# Patient Record
Sex: Male | Born: 1985 | Hispanic: No | Marital: Married | Smoking: Never smoker
Health system: Southern US, Community
[De-identification: ages and names within clinical notes are randomized; demographics above are authoritative.]

## PROBLEM LIST (undated history)

## (undated) DIAGNOSIS — N200 Calculus of kidney: Secondary | ICD-10-CM

---

## 2020-02-23 ENCOUNTER — Encounter (HOSPITAL_COMMUNITY): Payer: Self-pay | Admitting: Emergency Medicine

## 2020-02-23 ENCOUNTER — Emergency Department (HOSPITAL_COMMUNITY)
Admission: EM | Admit: 2020-02-23 | Discharge: 2020-02-24 | Disposition: A | Discharge status: Home / Self Care | Payer: Self-pay | Attending: Emergency Medicine | Admitting: Emergency Medicine

## 2020-02-23 ENCOUNTER — Emergency Department (HOSPITAL_COMMUNITY): Payer: Self-pay

## 2020-02-23 ENCOUNTER — Other Ambulatory Visit: Payer: Self-pay

## 2020-02-23 DIAGNOSIS — N201 Calculus of ureter: Secondary | ICD-10-CM | POA: Insufficient documentation

## 2020-02-23 DIAGNOSIS — F17200 Nicotine dependence, unspecified, uncomplicated: Secondary | ICD-10-CM | POA: Insufficient documentation

## 2020-02-23 LAB — URINALYSIS, ROUTINE W REFLEX MICROSCOPIC
Bilirubin Urine: NEGATIVE
Glucose, UA: NEGATIVE mg/dL
Ketones, ur: 5 mg/dL — AB
Leukocytes,Ua: NEGATIVE
Nitrite: NEGATIVE
Protein, ur: 30 mg/dL — AB
RBC / HPF: >50 RBC/hpf — ABNORMAL HIGH (ref 0–5)
Specific Gravity, Urine: 1.031 — ABNORMAL HIGH (ref 1.005–1.030)
pH: 5 (ref 5.0–8.0)

## 2020-02-23 LAB — CBC WITH DIFFERENTIAL/PLATELET
Abs Immature Granulocytes: 0.06 10*3/uL (ref 0.00–0.07)
Basophils Absolute: 0 10*3/uL (ref 0.0–0.1)
Basophils Relative: 0 %
Eosinophils Absolute: 0 10*3/uL (ref 0.0–0.5)
Eosinophils Relative: 0 %
HCT: 45.4 % (ref 39.0–52.0)
Hemoglobin: 15.3 g/dL (ref 13.0–17.0)
Immature Granulocytes: 0 %
Lymphocytes Relative: 8 %
Lymphs Abs: 1.2 10*3/uL (ref 0.7–4.0)
MCH: 29.2 pg (ref 26.0–34.0)
MCHC: 33.7 g/dL (ref 30.0–36.0)
MCV: 86.6 fL (ref 80.0–100.0)
Monocytes Absolute: 0.6 10*3/uL (ref 0.1–1.0)
Monocytes Relative: 4 %
Neutro Abs: 14.1 10*3/uL — ABNORMAL HIGH (ref 1.7–7.7)
Neutrophils Relative %: 88 %
Platelets: 323 10*3/uL (ref 150–400)
RBC: 5.24 MIL/uL (ref 4.22–5.81)
RDW: 12.5 % (ref 11.5–15.5)
WBC: 15.9 10*3/uL — ABNORMAL HIGH (ref 4.0–10.5)
nRBC: 0 % (ref 0.0–0.2)

## 2020-02-23 LAB — COMPREHENSIVE METABOLIC PANEL
ALT: 57 U/L — ABNORMAL HIGH (ref 0–44)
AST: 32 U/L (ref 15–41)
Albumin: 4.5 g/dL (ref 3.5–5.0)
Alkaline Phosphatase: 67 U/L (ref 38–126)
Anion gap: 8 (ref 5–15)
BUN: 20 mg/dL (ref 6–20)
CO2: 26 mmol/L (ref 22–32)
Calcium: 9.4 mg/dL (ref 8.9–10.3)
Chloride: 104 mmol/L (ref 98–111)
Creatinine, Ser: 1.02 mg/dL (ref 0.61–1.24)
GFR calc Af Amer: >60 mL/min (ref >60)
GFR calc non Af Amer: >60 mL/min (ref >60)
Glucose, Bld: 132 mg/dL — ABNORMAL HIGH (ref 70–99)
Potassium: 3.8 mmol/L (ref 3.5–5.1)
Sodium: 138 mmol/L (ref 135–145)
Total Bilirubin: 1.3 mg/dL — ABNORMAL HIGH (ref 0.3–1.2)
Total Protein: 8 g/dL (ref 6.5–8.1)

## 2020-02-23 LAB — LIPASE, BLOOD: Lipase: 30 U/L (ref 11–51)

## 2020-02-23 MED ORDER — MORPHINE SULFATE (PF) 4 MG/ML IV SOLN
4.0000 mg | Freq: Once | INTRAVENOUS | Status: AC
  Administered 2020-02-23: 4 mg via INTRAVENOUS
  Filled 2020-02-23: qty 1

## 2020-02-23 MED ORDER — HYDROCODONE-ACETAMINOPHEN 5-325 MG PO TABS
1.0000 | ORAL_TABLET | Freq: Once | ORAL | Status: AC
  Administered 2020-02-23: 1 via ORAL
  Filled 2020-02-23: qty 1

## 2020-02-23 MED ORDER — ONDANSETRON HCL 4 MG/2ML IJ SOLN
4.0000 mg | Freq: Once | INTRAMUSCULAR | Status: AC
  Administered 2020-02-23: 4 mg via INTRAVENOUS
  Filled 2020-02-23: qty 2

## 2020-02-23 MED ORDER — KETOROLAC TROMETHAMINE 15 MG/ML IJ SOLN: 15.0000 mg | Freq: Once | INTRAMUSCULAR | Status: DC

## 2020-02-23 MED ORDER — SODIUM CHLORIDE 0.9 % IV BOLUS
500.0000 mL | Freq: Once | INTRAVENOUS | Status: AC
  Administered 2020-02-23: 500 mL via INTRAVENOUS

## 2020-02-23 NOTE — ED Provider Notes (Signed)
[facility] EMERGENCY DEPARTMENT Provider Note   CSN: [id number] Arrival date & time: 02/23/20  0737     History Chief Complaint  Patient presents with  . Flank Pain    Keith Gay is a 34 y.o. male patient is a 34 year old man with no known past medical history who presents today for evaluation of left-sided flank pain.  His pain started 1730 tonight while he was resting. It started on the left side of his back and is now radiating around into his left lower quadrant anteriorly.  He reports that when the pain was bad he had 1 episode of vomiting.    He states that about 2 weeks ago he had a similar episode, however the pain only lasted about half an hour and he did not seek medical care and evaluation at that time.    He denies any diarrhea or constipation.  He denies history of prior abdominal surgeries.  He does report polyuria however denies dysuria.  He denies hematuria.  No fevers.  He denies any recent trauma.  He was given 30 mg of Toradol in route by EMS which has improved his pain.  History obtained through assistance with Spanish-speaking professional medical interpreter.  HPI     History reviewed. No pertinent past medical history.  There are no problems to display for this patient.   History reviewed. No pertinent surgical history.     No family history on file.  Social History   Tobacco Use  . Smoking status: Current Every Day Smoker    Packs/day: 0.50    Years: 15.00    Pack years: 7.50  . Smokeless tobacco: Never Used  Substance Use Topics  . Alcohol use: Never  . Drug use: Never    Home Medications Prior to Admission medications   Medication Sig Start Date End Date Taking? Authorizing Provider  HYDROcodone-acetaminophen (NORCO/VICODIN) 5-325 MG tablet Take 1 tablet by mouth every 6 (six) hours as needed for severe pain. 02/24/20   Lorin Glass, PA-C  ondansetron (ZOFRAN ODT) 4 MG disintegrating tablet Take 1 tablet (4 mg total) by mouth  every 8 (eight) hours as needed for nausea or vomiting. 02/24/20   Lorin Glass, PA-C  tamsulosin (FLOMAX) 0.4 MG CAPS capsule Take 1 capsule (0.4 mg total) by mouth daily. May stop once kidney stone passes 02/24/20   Lorin Glass, PA-C    Allergies    Patient has no known allergies.  Review of Systems   Review of Systems  Constitutional: Negative for chills, fatigue and fever.  Respiratory: Negative for choking and shortness of breath.   Cardiovascular: Negative for chest pain.  Gastrointestinal: Positive for abdominal pain, nausea and vomiting. Negative for constipation and diarrhea.  Endocrine: Positive for polyuria. Negative for polydipsia and polyphagia.  Genitourinary: Positive for flank pain and frequency. Negative for discharge, dysuria, hematuria, penile pain, scrotal swelling, testicular pain and urgency.  Musculoskeletal: Positive for back pain. Negative for neck pain.  Neurological: Negative for weakness and headaches.  Psychiatric/Behavioral: Negative for confusion.  All other systems reviewed and are negative.   Physical Exam Updated Vital Signs BP 115/79   Pulse 73   Temp 99.3 F (37.4 C) (Oral)   Resp 15   Ht 5\' 6"  (1.676 m)   Wt 99.8 kg   SpO2 100%   BMI 35.51 kg/m   Physical Exam Vitals and nursing note reviewed.  Constitutional:      General: He is not in acute distress.  Appearance: He is well-developed. He is not ill-appearing.  HENT:     Head: Normocephalic and atraumatic.     Mouth/Throat:     Mouth: Mucous membranes are moist.  Eyes:     Conjunctiva/sclera: Conjunctivae normal.  Cardiovascular:     Rate and Rhythm: Normal rate and regular rhythm.     Pulses: Normal pulses.     Heart sounds: Normal heart sounds. No murmur.  Pulmonary:     Effort: Pulmonary effort is normal. No respiratory distress.     Breath sounds: Normal breath sounds.  Abdominal:     Palpations: Abdomen is soft.     Tenderness: There is no abdominal  tenderness (Left lower quadrant). There is left CVA tenderness. There is no guarding or rebound.  Musculoskeletal:     Cervical back: Normal range of motion and neck supple.     Right lower leg: No edema.     Left lower leg: No edema.  Skin:    General: Skin is warm and dry.  Neurological:     General: No focal deficit present.     Mental Status: He is alert and oriented to person, place, and time.  Psychiatric:        Mood and Affect: Mood normal.        Behavior: Behavior normal.     ED Results / Procedures / Treatments   Labs (all labs ordered are listed, but only abnormal results are displayed) Labs Reviewed  URINALYSIS, ROUTINE W REFLEX MICROSCOPIC - Abnormal; Notable for the following components:      Result Value   APPearance TURBID (*)    Specific Gravity, Urine 1.031 (*)    Hgb urine dipstick LARGE (*)    Ketones, ur 5 (*)    Protein, ur 30 (*)    RBC / HPF >50 (*)    Bacteria, UA RARE (*)    Non Squamous Epithelial 0-5 (*)    All other components within normal limits  COMPREHENSIVE METABOLIC PANEL - Abnormal; Notable for the following components:   Glucose, Bld 132 (*)    ALT 57 (*)    Total Bilirubin 1.3 (*)    All other components within normal limits  CBC WITH DIFFERENTIAL/PLATELET - Abnormal; Notable for the following components:   WBC 15.9 (*)    Neutro Abs 14.1 (*)    All other components within normal limits  URINE CULTURE  LIPASE, BLOOD    EKG None  Radiology CT Renal Stone Study  Result Date: 02/23/2020 CLINICAL DATA:  Left-sided flank pain. EXAM: CT ABDOMEN AND PELVIS WITHOUT CONTRAST TECHNIQUE: Multidetector CT imaging of the abdomen and pelvis was performed following the standard protocol without IV contrast. COMPARISON:  None. FINDINGS: Lower chest: The lung bases are clear. The heart size is normal. Hepatobiliary: There is decreased hepatic attenuation suggestive of hepatic steatosis. Normal gallbladder.There is no biliary ductal dilation.  Pancreas: Normal contours without ductal dilatation. No peripancreatic fluid collection. Spleen: No splenic laceration or hematoma. Adrenals/Urinary Tract: --Adrenal glands: No adrenal hemorrhage. --Right kidney/ureter: No hydronephrosis or perinephric hematoma. --Left kidney/ureter: There is mild left-sided hydronephrosis secondary to an obstructing 6 mm stone at the left UVJ (axial series 2, image 90). There are no residual remaining stones in the left kidney. --Urinary bladder: Unremarkable. Stomach/Bowel: --Stomach/Duodenum: No hiatal hernia or other gastric abnormality. Normal duodenal course and caliber. --Small bowel: No dilatation or inflammation. --Colon: No focal abnormality. --Appendix: Normal. Vascular/Lymphatic: Normal course and caliber of the major abdominal vessels. --No retroperitoneal  lymphadenopathy. --No mesenteric lymphadenopathy. --No pelvic or inguinal lymphadenopathy. Reproductive: Unremarkable Other: No ascites or free air. The abdominal wall is normal. Musculoskeletal. No acute displaced fractures. IMPRESSION: Mild left-sided hydronephrosis secondary to an obstructing 6 mm stone at the left UVJ. Hepatic steatosis Electronically Signed   By: Katherine Mantle M.D.   On: 02/23/2020 22:19    Procedures Procedures (including critical care time)  Medications Ordered in ED Medications  ondansetron (ZOFRAN) injection 4 mg (4 mg Intravenous Given 02/23/20 2036)  sodium chloride 0.9 % bolus 500 mL (0 mLs Intravenous Stopped 02/23/20 2130)  morphine 4 MG/ML injection 4 mg (4 mg Intravenous Given 02/23/20 2036)  HYDROcodone-acetaminophen (NORCO/VICODIN) 5-325 MG per tablet 1 tablet (1 tablet Oral Given 02/23/20 2236)  morphine 4 MG/ML injection 4 mg (4 mg Intravenous Given 02/23/20 2319)    ED Course  I have reviewed the triage vital signs and the nursing notes.  Pertinent labs & imaging results that were available during my care of the patient were reviewed by me and considered in my medical  decision making (see chart for details).  Clinical Course as of Feb 24 23  Mon Feb 23, 2020  2315 Patient reevaluated, his pain is returning.  He did get a Vicodin half an hour ago however appears uncomfortable at this time.  Additional dose of morphine is ordered.  He was given Toradol in route by EMS therefore additional Toradol is not ordered.   [EH]  Tue Feb 24, 2020  0016 Patient is reevaluated, when I initially walked in the room he was sleeping with his left arm up over his head.  This is the arm that his blood pressure cuff and pulse oximeter were around.  When awakened and his arm appropriately positioned his blood pressure became 115/79, Sp02 100%.  He reports his pain is gone at this time and wishes for discharge home.     [EH]    Clinical Course User Index [EH] Norman Clay   MDM Rules/Calculators/A&P                     Patient presents today for evaluation of left-sided flank pain radiating into his left lower abdomen.  Does not have a known history of kidney stones. On exam he has left-sided CVA tenderness to percussion along with left lower quadrant abdominal pain. Highly suspicious for ureteral stone. Given that this is his first occurrence plan to obtain CT renal study.  In addition, given his polyuria we will obtain basic labs, UA.  Will attempt symptomatic management.  CT renal study shows a left-sided 6 mm ureteral left at the level of the UVJ that appears to be obstructing causing mild left-sided hydronephrosis.    Urine shows over 50 red cells with rare bacteria, no white cells and 0-5 squamous epithelial cells.  Urine is sent for culture. CMP is unremarkable.  CBC does show mild leukocytosis which I suspect is reactive secondary to pain. Lipase is not elevated.  Patient's pain was treated with morphine IV x2.  He was given Toradol by EMS prior to arrival therefore it was not repeated. He was treated with Zofran here.  He was able to p.o. challenge  without difficulty. [facility] PMP is reviewed and he is given prescription for Vicodin in addition to Flomax and Zofran. We discussed that as a stone is 6 mm there is a possibility that it may not pass.  We discussed the importance of outpatient follow-up with urology.  In addition recommended that if he needs additional emergency medical care that he go to [facility] to be seen there.  Return precautions were discussed with patient who states their understanding.  At the time of discharge patient denied any unaddressed complaints or concerns.  Patient is agreeable for discharge home.  Note: Portions of this report may have been transcribed using voice recognition software. Every effort was made to ensure accuracy; however, inadvertent computerized transcription errors may be present  Final Clinical Impression(s) / ED Diagnoses Final diagnoses:  Ureterolithiasis    Rx / DC Orders ED Discharge Orders         Ordered    HYDROcodone-acetaminophen (NORCO/VICODIN) 5-325 MG tablet  Every 6 hours PRN     02/24/20 0022    ondansetron (ZOFRAN ODT) 4 MG disintegrating tablet  Every 8 hours PRN     02/24/20 0022    tamsulosin (FLOMAX) 0.4 MG CAPS capsule  Daily     02/24/20 0022           Cristina Gong, PA-C 02/24/20 2094    Vanetta Mulders, MD 02/26/20 1930

## 2020-02-23 NOTE — Discharge Instructions (Addendum)

## 2020-02-23 NOTE — ED Notes (Signed)
Pt given saltines and something to drink

## 2020-02-23 NOTE — ED Triage Notes (Addendum)
Pt from home via [facility]. C/O left sided flank pain that started at 1730 tonight. Denies dysuria. Also C/O polyuria. Pt received 30mg  Toradol en route.

## 2020-02-24 MED ORDER — ONDANSETRON 4 MG PO TBDP: 4.0000 mg | ORAL_TABLET | Freq: Three times a day (TID) | ORAL | 0 refills | Status: AC | PRN

## 2020-02-24 MED ORDER — TAMSULOSIN HCL 0.4 MG PO CAPS: 0.4000 mg | ORAL_CAPSULE | Freq: Every day | ORAL | 0 refills | Status: AC

## 2020-02-24 MED ORDER — HYDROCODONE-ACETAMINOPHEN 5-325 MG PO TABS: 1.0000 | ORAL_TABLET | Freq: Four times a day (QID) | ORAL | 0 refills | Status: AC | PRN

## 2020-02-25 LAB — URINE CULTURE: Culture: NO GROWTH

## 2020-06-20 ENCOUNTER — Other Ambulatory Visit: Payer: Self-pay

## 2020-06-20 ENCOUNTER — Emergency Department
Admission: EM | Admit: 2020-06-20 | Discharge: 2020-06-20 | Disposition: A | Discharge status: Home / Self Care | Payer: Self-pay | Attending: Emergency Medicine | Admitting: Emergency Medicine

## 2020-06-20 ENCOUNTER — Encounter: Payer: Self-pay | Admitting: Emergency Medicine

## 2020-06-20 DIAGNOSIS — M25519 Pain in unspecified shoulder: Secondary | ICD-10-CM | POA: Insufficient documentation

## 2020-06-20 DIAGNOSIS — Z87891 Personal history of nicotine dependence: Secondary | ICD-10-CM | POA: Insufficient documentation
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
## 2020-06-20 NOTE — Discharge Instructions (Signed)
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
## 2020-10-04 IMAGING — CT CT RENAL STONE PROTOCOL
2 of 4 series · 16 of 46 positions shown, 18 images · non-contrast
Comparison: None.

CLINICAL DATA: Left-sided flank pain.

EXAM:
CT ABDOMEN AND PELVIS WITHOUT CONTRAST
TECHNIQUE: Multidetector CT imaging of the abdomen and pelvis was performed
following the standard protocol without IV contrast.

[Series 2: axial st · axial · 0.87mm/px · z∈[-358,+112]mm · 13 of 108 slices shown, 15 images]
[im 7/108  soft-tissue]
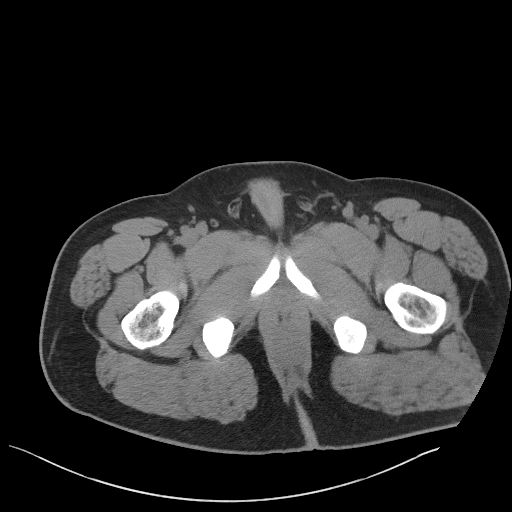
[im 7/108  bone]
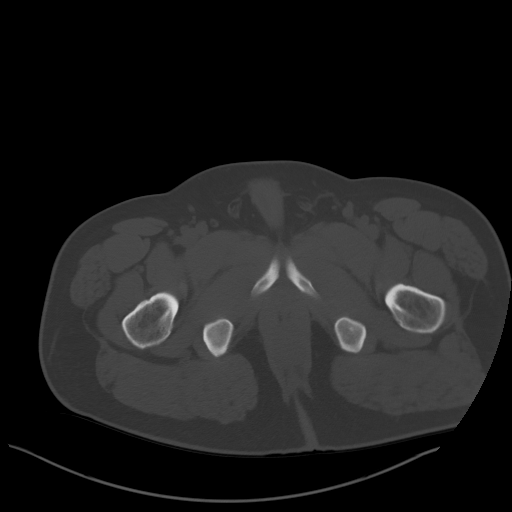
[im 14/108  soft-tissue]
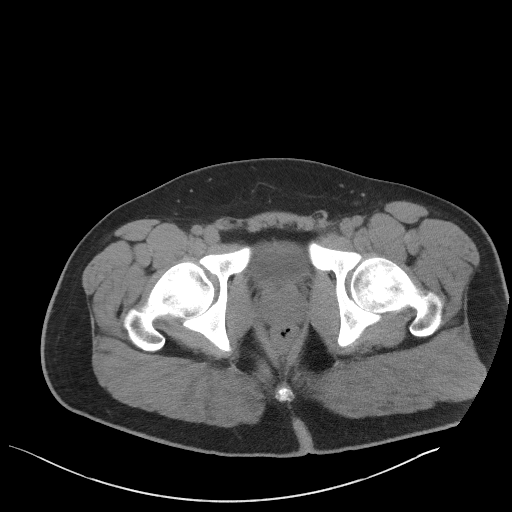
[im 21/108  soft-tissue]
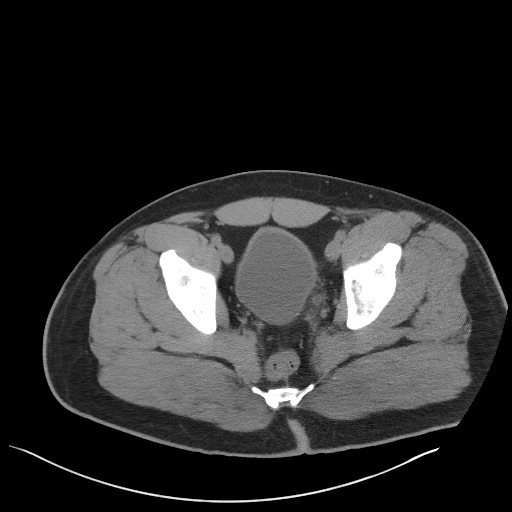
[im 34/108  soft-tissue]
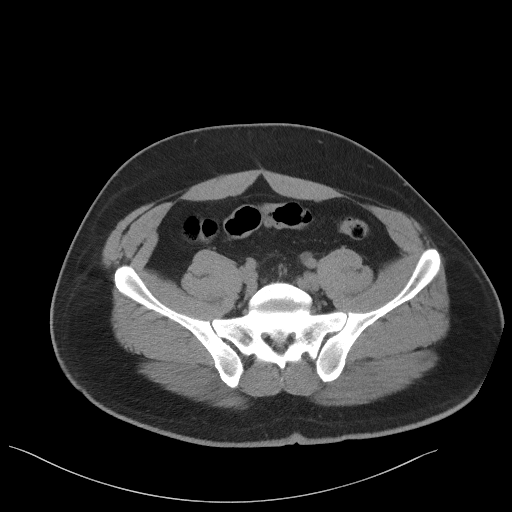
[im 41/108  soft-tissue]
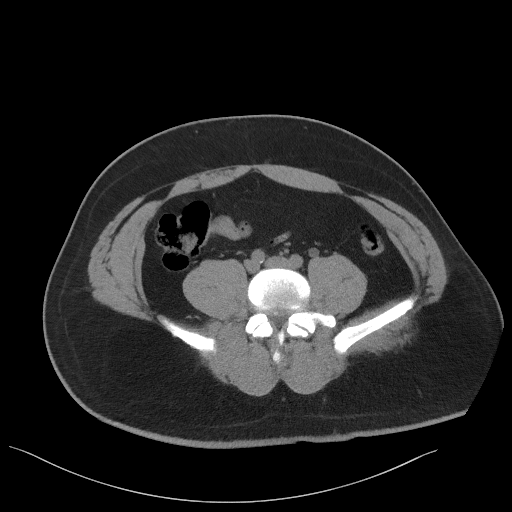
[im 47/108  soft-tissue]
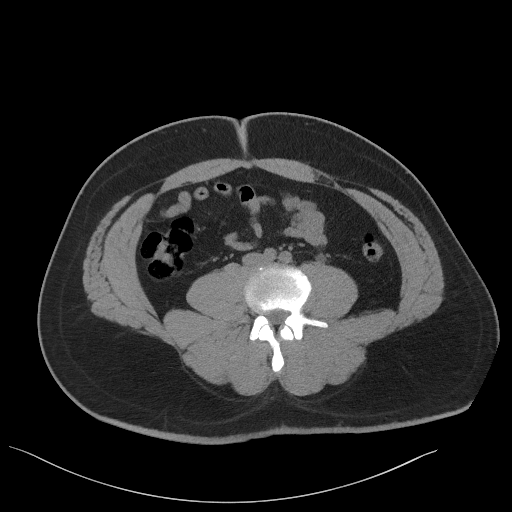
[im 54/108  soft-tissue]
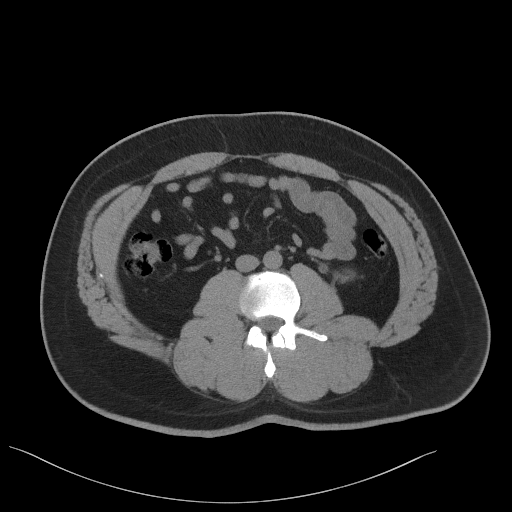
[im 61/108  soft-tissue]
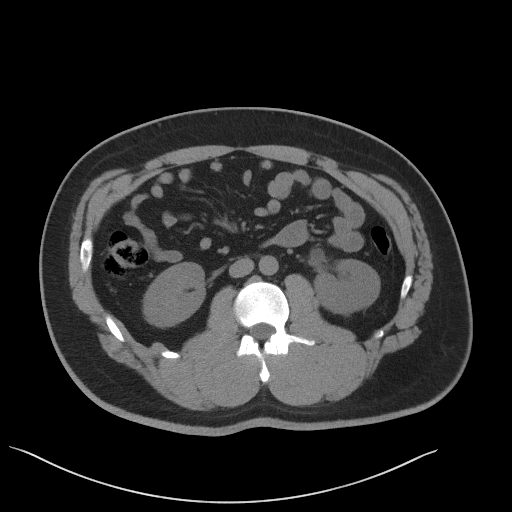
[im 67/108  soft-tissue]
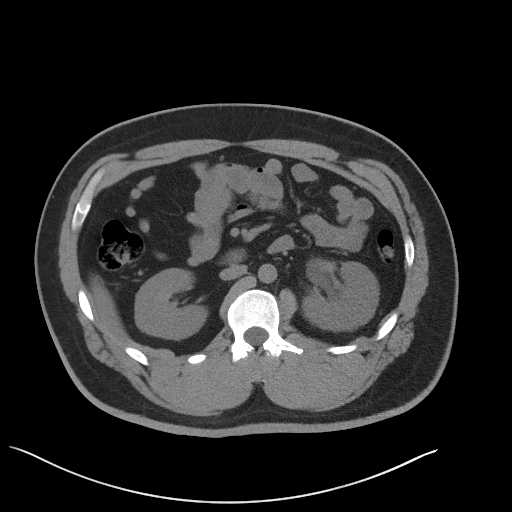
[im 67/108  bone]
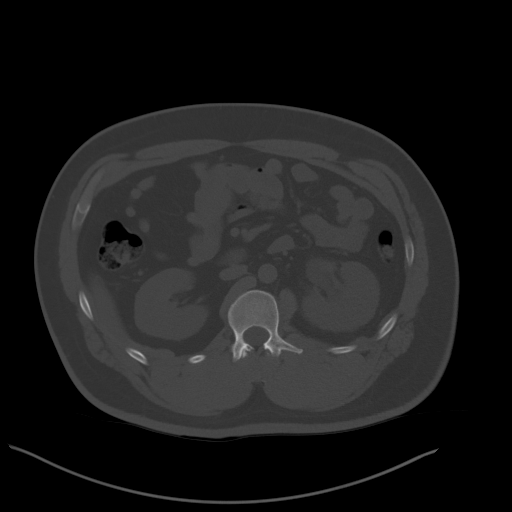
[im 74/108  soft-tissue]
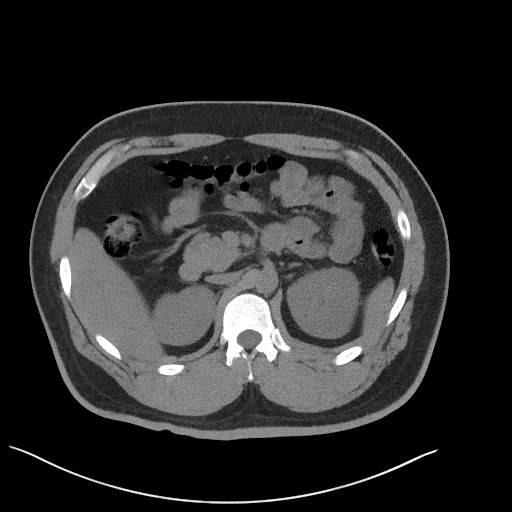
[im 87/108  soft-tissue]
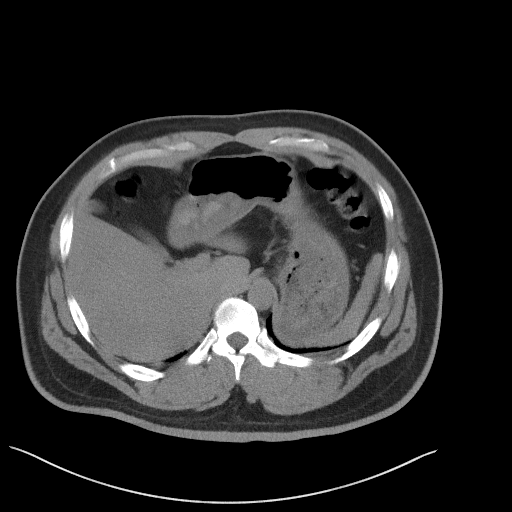
[im 94/108  soft-tissue]
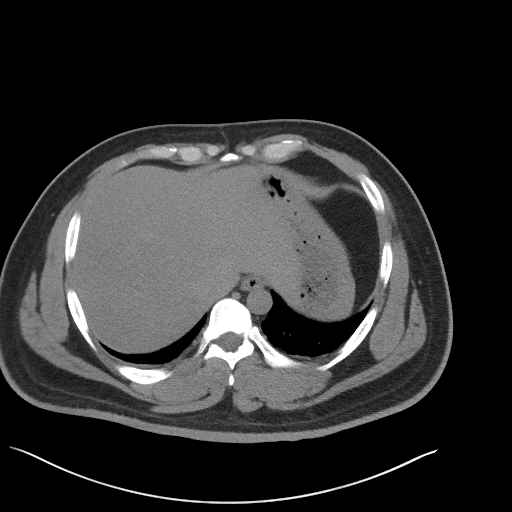
[im 101/108  soft-tissue]
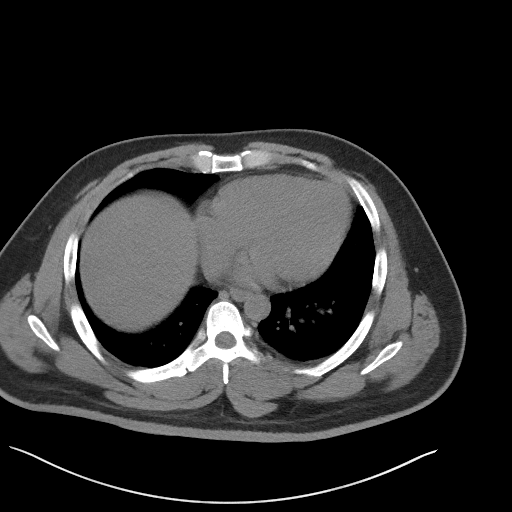

[Series 5: coronal st · coronal · 0.90mm/px · 3 of 101 slices shown]
[im 34/101  soft-tissue]
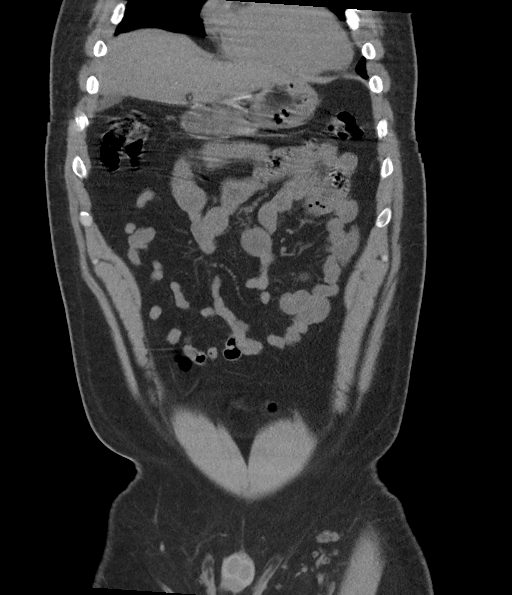
[im 45/101  soft-tissue]
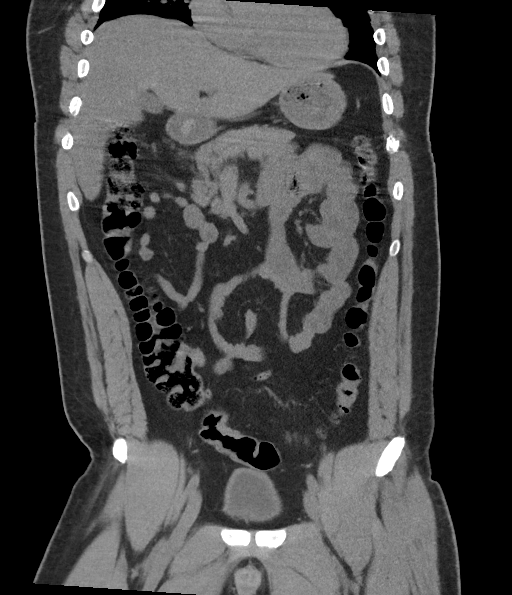
[im 56/101  soft-tissue]
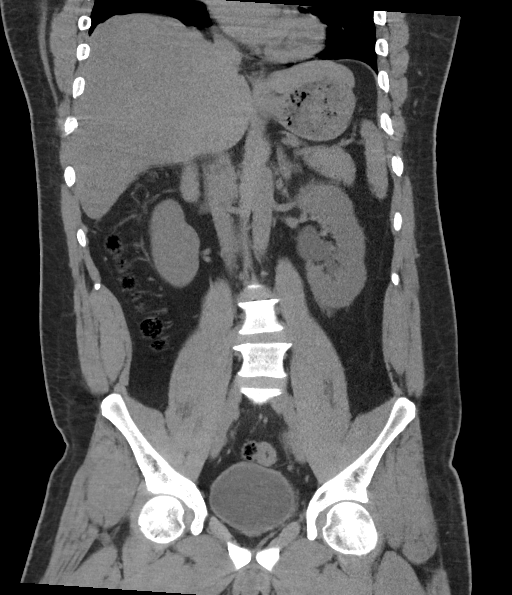

[16 of 46 positions shown; findings below may reference images not displayed]

FINDINGS: Lower chest: The lung bases are clear. The heart size is normal.

Hepatobiliary: There is decreased hepatic attenuation suggestive of
hepatic steatosis. Normal gallbladder.There is no biliary ductal
dilation.

Pancreas: Normal contours without ductal dilatation. No
peripancreatic fluid collection.

Spleen: No splenic laceration or hematoma.

Adrenals/Urinary Tract:

--Adrenal glands: No adrenal hemorrhage.

--Right kidney/ureter: No hydronephrosis or perinephric hematoma.

--Left kidney/ureter: There is mild left-sided hydronephrosis
secondary to an obstructing 6 mm stone at the left UVJ (axial series
2, image 90). There are no residual remaining stones in the left
kidney.

--Urinary bladder: Unremarkable.

Stomach/Bowel:

--Stomach/Duodenum: No hiatal hernia or other gastric abnormality.
Normal duodenal course and caliber.

--Small bowel: No dilatation or inflammation.

--Colon: No focal abnormality.

--Appendix: Normal.

Vascular/Lymphatic: Normal course and caliber of the major abdominal
vessels.

--No retroperitoneal lymphadenopathy.

--No mesenteric lymphadenopathy.

--No pelvic or inguinal lymphadenopathy.

Reproductive: Unremarkable

Other: No ascites or free air. The abdominal wall is normal.

Musculoskeletal. No acute displaced fractures.
IMPRESSION: Mild left-sided hydronephrosis secondary to an obstructing 6 mm
stone at the left UVJ.

Hepatic steatosis

## 2024-04-06 ENCOUNTER — Other Ambulatory Visit: Payer: Self-pay

## 2024-04-06 ENCOUNTER — Emergency Department (HOSPITAL_COMMUNITY)
Admission: EM | Admit: 2024-04-06 | Discharge: 2024-04-06 | Disposition: A | Discharge status: Home / Self Care | Payer: Self-pay | Attending: Emergency Medicine | Admitting: Emergency Medicine

## 2024-04-06 ENCOUNTER — Encounter (HOSPITAL_COMMUNITY): Payer: Self-pay | Admitting: Emergency Medicine

## 2024-04-06 ENCOUNTER — Emergency Department (HOSPITAL_COMMUNITY): Payer: Self-pay

## 2024-04-06 DIAGNOSIS — N200 Calculus of kidney: Secondary | ICD-10-CM | POA: Insufficient documentation

## 2024-04-06 LAB — CBC WITH DIFFERENTIAL/PLATELET
Abs Immature Granulocytes: 0.01 10*3/uL (ref 0.00–0.07)
Basophils Absolute: 0.1 10*3/uL (ref 0.0–0.1)
Basophils Relative: 1 %
Eosinophils Absolute: 0.1 10*3/uL (ref 0.0–0.5)
Eosinophils Relative: 2 %
HCT: 47.4 % (ref 39.0–52.0)
Hemoglobin: 15.7 g/dL (ref 13.0–17.0)
Immature Granulocytes: 0 %
Lymphocytes Relative: 41 %
Lymphs Abs: 3 10*3/uL (ref 0.7–4.0)
MCH: 28.6 pg (ref 26.0–34.0)
MCHC: 33.1 g/dL (ref 30.0–36.0)
MCV: 86.3 fL (ref 80.0–100.0)
Monocytes Absolute: 0.5 10*3/uL (ref 0.1–1.0)
Monocytes Relative: 7 %
Neutro Abs: 3.7 10*3/uL (ref 1.7–7.7)
Neutrophils Relative %: 49 %
Platelets: 321 10*3/uL (ref 150–400)
RBC: 5.49 MIL/uL (ref 4.22–5.81)
RDW: 12.8 % (ref 11.5–15.5)
WBC: 7.5 10*3/uL (ref 4.0–10.5)
nRBC: 0 % (ref 0.0–0.2)

## 2024-04-06 LAB — URINALYSIS, ROUTINE W REFLEX MICROSCOPIC
Bacteria, UA: NONE SEEN
Bilirubin Urine: NEGATIVE
Glucose, UA: NEGATIVE mg/dL
Ketones, ur: NEGATIVE mg/dL
Leukocytes,Ua: NEGATIVE
Nitrite: NEGATIVE
Protein, ur: 30 mg/dL — AB
RBC / HPF: >50 RBC/hpf (ref 0–5)
Specific Gravity, Urine: 1.024 (ref 1.005–1.030)
pH: 5 (ref 5.0–8.0)

## 2024-04-06 LAB — BASIC METABOLIC PANEL WITH GFR
Anion gap: 11 (ref 5–15)
BUN: 16 mg/dL (ref 6–20)
CO2: 21 mmol/L — ABNORMAL LOW (ref 22–32)
Calcium: 9.3 mg/dL (ref 8.9–10.3)
Chloride: 106 mmol/L (ref 98–111)
Creatinine, Ser: 0.95 mg/dL (ref 0.61–1.24)
GFR, Estimated: >60 mL/min (ref >60)
Glucose, Bld: 139 mg/dL — ABNORMAL HIGH (ref 70–99)
Potassium: 3.9 mmol/L (ref 3.5–5.1)
Sodium: 138 mmol/L (ref 135–145)

## 2024-04-06 MED ORDER — HYDROCODONE-ACETAMINOPHEN 5-325 MG PO TABS: 1.0000 | ORAL_TABLET | Freq: Four times a day (QID) | ORAL | 0 refills | Status: AC | PRN

## 2024-04-06 MED ORDER — ONDANSETRON HCL 4 MG/2ML IJ SOLN
4.0000 mg | Freq: Once | INTRAMUSCULAR | Status: AC
  Administered 2024-04-06: 4 mg via INTRAVENOUS
  Filled 2024-04-06: qty 2

## 2024-04-06 MED ORDER — KETOROLAC TROMETHAMINE 30 MG/ML IJ SOLN
30.0000 mg | Freq: Once | INTRAMUSCULAR | Status: AC
  Administered 2024-04-06: 30 mg via INTRAVENOUS
  Filled 2024-04-06: qty 1

## 2024-04-06 MED ORDER — MORPHINE SULFATE (PF) 4 MG/ML IV SOLN
4.0000 mg | Freq: Once | INTRAVENOUS | Status: AC
  Administered 2024-04-06: 4 mg via INTRAVENOUS
  Filled 2024-04-06: qty 1

## 2024-04-06 NOTE — ED Provider Notes (Signed)
 [facility] EMERGENCY DEPARTMENT AT [facility] Provider Note   CSN: [id number] Arrival date & time: 04/06/24  0315     History  Chief Complaint  Patient presents with   Flank Pain    Littleton Haub is a 38 y.o. male.  Patient is a 38 year old male with history of kidney stones.  He presents today with right flank pain that started 1 hour ago and woke him from sleep.  Pain is severe and radiates into his groin.  No urinary complaints.  No fevers or chills.  No bowel issues.  This feels similar to what he experienced with prior kidney stones.       Home Medications Prior to Admission medications   Not on File      Allergies    Patient has no known allergies.    Review of Systems   Review of Systems  All other systems reviewed and are negative.   Physical Exam Updated Vital Signs BP (!) 146/111 (BP Location: Left Arm)   Pulse 69   Temp 98.4 F (36.9 C) (Oral)   Resp 20   Ht 5\' 6"  (1.676 m)   Wt 99.8 kg   SpO2 98%   BMI 35.51 kg/m  Physical Exam Vitals and nursing note reviewed.  Constitutional:      General: He is not in acute distress.    Appearance: He is well-developed. He is not diaphoretic.  HENT:     Head: Normocephalic and atraumatic.  Cardiovascular:     Rate and Rhythm: Normal rate and regular rhythm.     Heart sounds: No murmur heard.    No friction rub.  Pulmonary:     Effort: Pulmonary effort is normal. No respiratory distress.     Breath sounds: Normal breath sounds. No wheezing or rales.  Abdominal:     General: Bowel sounds are normal. There is no distension.     Palpations: Abdomen is soft.     Tenderness: There is no abdominal tenderness. There is right CVA tenderness. There is no left CVA tenderness, guarding or rebound.  Musculoskeletal:        General: Normal range of motion.     Cervical back: Normal range of motion and neck supple.  Skin:    General: Skin is warm and dry.  Neurological:     Mental Status: He is alert  and oriented to person, place, and time.     Coordination: Coordination normal.     ED Results / Procedures / Treatments   Labs (all labs ordered are listed, but only abnormal results are displayed) Labs Reviewed  URINALYSIS, ROUTINE W REFLEX MICROSCOPIC  BASIC METABOLIC PANEL WITH GFR  CBC WITH DIFFERENTIAL/PLATELET    EKG None  Radiology No results found.  Procedures Procedures    Medications Ordered in ED Medications  ondansetron  (ZOFRAN ) injection 4 mg (has no administration in time range)  morphine  (PF) 4 MG/ML injection 4 mg (has no administration in time range)  ketorolac  (TORADOL ) 30 MG/ML injection 30 mg (has no administration in time range)    ED Course/ Medical Decision Making/ A&P  Patient is a 38 year old male presenting with complaints of right flank pain that woke him from sleep.  Patient arrives with stable vital signs, but is quite uncomfortable appearing on exam.  He does have right-sided CVA tenderness.  He is afebrile.  Laboratory studies obtained including CBC and basic metabolic panel, both of which are unremarkable.  Urinalysis shows microscopic hematuria, but no infection.  CT with renal protocol obtained showing a recently passed stone in the urinary bladder.  Patient has received morphine  for pain and Zofran  for nausea.  He was also given Toradol  and he is now markedly improved.  It appears as though he spontaneously passed the stone and seems appropriate for discharge.  I will prescribe a small quantity of pain medication for any residual pain he may be experiencing and have him follow-up as needed.  Final Clinical Impression(s) / ED Diagnoses Final diagnoses:  None    Rx / DC Orders ED Discharge Orders     None         Orvilla Blander, MD 04/06/24 [phone]

## 2024-04-06 NOTE — ED Notes (Signed)
Patient verbalizes understanding of discharge instructions. Opportunity for questioning and answers were provided. Armband removed by staff, pt discharged from ED. Ambulated out to lobby  

## 2024-04-06 NOTE — ED Triage Notes (Signed)
 Pt with c/o R flank pain, Nausea, and vomiting. Hx of kidney stones.

## 2024-04-06 NOTE — Discharge Instructions (Signed)
 Begin taking hydrocodone  as prescribed as needed for pain.  Return to the ER if you develop any new and/or concerning issues.
